# Patient Record
Sex: Male | Born: 1995
Health system: Southern US, Community
[De-identification: ages and names within clinical notes are randomized; demographics above are authoritative.]

## PROBLEM LIST (undated history)

## (undated) DIAGNOSIS — L700 Acne vulgaris: Secondary | ICD-10-CM

## (undated) DIAGNOSIS — L03213 Periorbital cellulitis: Secondary | ICD-10-CM

## (undated) HISTORY — DX: Periorbital cellulitis: L03.213

## (undated) HISTORY — DX: Acne vulgaris: L70.0

---

## 2016-10-18 DIAGNOSIS — L03213 Periorbital cellulitis: Secondary | ICD-10-CM

## 2016-10-18 HISTORY — DX: Periorbital cellulitis: L03.213

## 2016-11-03 DIAGNOSIS — J019 Acute sinusitis, unspecified: Secondary | ICD-10-CM | POA: Diagnosis not present

## 2016-11-03 DIAGNOSIS — R51 Headache: Secondary | ICD-10-CM | POA: Diagnosis not present

## 2016-11-04 ENCOUNTER — Emergency Department (HOSPITAL_BASED_OUTPATIENT_CLINIC_OR_DEPARTMENT_OTHER): Payer: BLUE CROSS/BLUE SHIELD

## 2016-11-04 ENCOUNTER — Encounter (HOSPITAL_BASED_OUTPATIENT_CLINIC_OR_DEPARTMENT_OTHER): Payer: Self-pay | Admitting: *Deleted

## 2016-11-04 ENCOUNTER — Emergency Department (HOSPITAL_BASED_OUTPATIENT_CLINIC_OR_DEPARTMENT_OTHER)
Admission: EM | Admit: 2016-11-04 | Discharge: 2016-11-04 | Disposition: A | Discharge status: Home / Self Care | Payer: BLUE CROSS/BLUE SHIELD | Attending: Emergency Medicine | Admitting: Emergency Medicine

## 2016-11-04 DIAGNOSIS — L03213 Periorbital cellulitis: Secondary | ICD-10-CM | POA: Insufficient documentation

## 2016-11-04 DIAGNOSIS — R110 Nausea: Secondary | ICD-10-CM | POA: Diagnosis not present

## 2016-11-04 DIAGNOSIS — R220 Localized swelling, mass and lump, head: Secondary | ICD-10-CM | POA: Diagnosis not present

## 2016-11-04 DIAGNOSIS — R51 Headache: Secondary | ICD-10-CM | POA: Diagnosis present

## 2016-11-04 DIAGNOSIS — J0100 Acute maxillary sinusitis, unspecified: Secondary | ICD-10-CM | POA: Diagnosis not present

## 2016-11-04 MED ORDER — PROCHLORPERAZINE EDISYLATE 5 MG/ML IJ SOLN
10.0000 mg | Freq: Once | INTRAMUSCULAR | Status: AC
  Administered 2016-11-04: 10 mg via INTRAMUSCULAR
  Filled 2016-11-04: qty 2

## 2016-11-04 MED ORDER — CLINDAMYCIN HCL 150 MG PO CAPS
450.0000 mg | ORAL_CAPSULE | Freq: Once | ORAL | Status: AC
Start: 2016-11-04 — End: 2016-11-04
  Administered 2016-11-04: 450 mg via ORAL
  Filled 2016-11-04: qty 3

## 2016-11-04 MED ORDER — DIPHENHYDRAMINE HCL 50 MG/ML IJ SOLN
25.0000 mg | Freq: Once | INTRAMUSCULAR | Status: AC
  Administered 2016-11-04: 25 mg via INTRAMUSCULAR
  Filled 2016-11-04: qty 1

## 2016-11-04 MED ORDER — AMOXICILLIN-POT CLAVULANATE 875-125 MG PO TABS: 1.0000 | ORAL_TABLET | Freq: Two times a day (BID) | ORAL | 0 refills | Status: DC

## 2016-11-04 MED ORDER — SODIUM CHLORIDE 0.9 % IV BOLUS (SEPSIS)
1000.0000 mL | Freq: Once | INTRAVENOUS | Status: AC
  Administered 2016-11-04: 1000 mL via INTRAVENOUS

## 2016-11-04 MED ORDER — IOPAMIDOL (ISOVUE-300) INJECTION 61%
100.0000 mL | Freq: Once | INTRAVENOUS | Status: AC | PRN
  Administered 2016-11-04: 100 mL via INTRAVENOUS

## 2016-11-04 MED ORDER — MAGNESIUM SULFATE 2 GM/50ML IV SOLN
2.0000 g | Freq: Once | INTRAVENOUS | Status: AC
  Administered 2016-11-04: 2 g via INTRAVENOUS
  Filled 2016-11-04: qty 50

## 2016-11-04 MED ORDER — KETOROLAC TROMETHAMINE 30 MG/ML IJ SOLN
30.0000 mg | Freq: Once | INTRAMUSCULAR | Status: AC
  Administered 2016-11-04: 30 mg via INTRAVENOUS
  Filled 2016-11-04: qty 1

## 2016-11-04 NOTE — Discharge Instructions (Signed)
Take tylenol 2 pills 4 times a day and motrin 4 pills 3 times a day.  Drink plenty of fluids.  Return for worsening shortness of breath, headache, confusion. Follow up with your family doctor.   

## 2016-11-04 NOTE — ED Provider Notes (Signed)
[facility] Provider Note   CSN: [id number] Arrival date & time: 11/04/16  1545  By signing my name below, I, Vista Mink, attest that this documentation has been prepared under the direction and in the presence of No att. providers found. Electronically signed, Vista Mink, ED Scribe. 11/04/16. 4:34 PM.  History   Chief Complaint Chief Complaint  Patient presents with  . Headache    HPI HPI Comments: Max Simon is a 20 y.o. male, with no pertinent PMHx, who presents to the Emergency Department complaining of gradually worsening, constant headache that started two days ago. Pt reports associated symptoms including photophobia and nausea but has not vomited yet. He also reports a pain described as "around the sides of my eyes". Pt has exacerbating pain with movement of the eyes. He went to urgent care yesterday and was dx with a sinus infection. He was given  a prescription for Fioricet and has taken it as directed. No acute numbness, tingling or unilateral weakness. He has not had any difficulty ambulating. No fever, neck pain or congestion.  The history is provided by the patient and a parent. No language interpreter was used.    No past medical history on file.  There are no active problems to display for this patient.   History reviewed. No pertinent surgical history.   Home Medications    Prior to Admission medications   Medication Sig Start Date End Date Taking? Authorizing Provider  amoxicillin-clavulanate (AUGMENTIN) 875-125 MG tablet Take 1 tablet by mouth every 12 (twelve) hours. 11/04/16   Melene Plan, DO    Family History No family history on file.  Social History Social History  Substance Use Topics  . Smoking status: Never Smoker  . Smokeless tobacco: Never Used  . Alcohol use Not on file    Allergies   Patient has no known allergies.   Review of Systems Review of Systems  Constitutional: Negative for chills and fever.  HENT: Negative  for congestion and facial swelling.   Eyes: Positive for photophobia and pain. Negative for discharge and visual disturbance.  Respiratory: Negative for shortness of breath.   Cardiovascular: Negative for chest pain and palpitations.  Gastrointestinal: Positive for nausea. Negative for abdominal pain, diarrhea and vomiting.  Musculoskeletal: Negative for arthralgias and myalgias.  Skin: Negative for color change and rash.  Neurological: Positive for headaches. Negative for tremors and syncope.  Psychiatric/Behavioral: Negative for confusion and dysphoric mood.  All other systems reviewed and are negative.   Physical Exam Updated Vital Signs BP 112/65 (BP Location: Right Arm)   Pulse 100   Temp 97.8 F (36.6 C) (Oral)   Resp 18   Ht 5\' 11"  (1.803 m)   Wt 165 lb (74.8 kg)   SpO2 100%   BMI 23.01 kg/m   Physical Exam  Constitutional: He is oriented to person, place, and time. He appears well-developed and well-nourished.  HENT:  Head: Normocephalic and atraumatic.  Eyes: EOM are normal. Pupils are equal, round, and reactive to light.  Erythema around the right eye with mild swelling noted. EOM's intact and did not seem painful. Swollen turbunance. Posterior nasal drip.  Neck: Normal range of motion. Neck supple. No JVD present.  Cardiovascular: Normal rate and regular rhythm.  Exam reveals no gallop and no friction rub.   No murmur heard. Pulmonary/Chest: No respiratory distress. He has no wheezes.  Abdominal: He exhibits no distension. There is no rebound and no guarding.  Musculoskeletal: Normal range of motion.  Neurological: He is alert and oriented to person, place, and time. He has normal strength. No cranial nerve deficit or sensory deficit. He displays a negative Romberg sign. Coordination and gait normal. GCS eye subscore is 4. GCS verbal subscore is 5. GCS motor subscore is 6.  Reflex Scores:      Tricep reflexes are 2+ on the right side and 2+ on the left side.       Bicep reflexes are 2+ on the right side and 2+ on the left side.      Brachioradialis reflexes are 2+ on the right side and 2+ on the left side.      Patellar reflexes are 2+ on the right side and 2+ on the left side.      Achilles reflexes are 2+ on the right side and 2+ on the left side. Normal neuro exam  Skin: No rash noted. No pallor.  Psychiatric: He has a normal mood and affect. His behavior is normal.  Nursing note and vitals reviewed.    ED Treatments / Results  DIAGNOSTIC STUDIES: Oxygen Saturation is 100% on RA, normal by my interpretation.  COORDINATION OF CARE: 4:33 PM-Discussed treatment plan with pt at bedside and pt agreed to plan.   Labs (all labs ordered are listed, but only abnormal results are displayed) Labs Reviewed - No data to display  EKG  EKG Interpretation None       Radiology Ct Maxillofacial W Contrast  Result Date: 11/04/2016 CLINICAL DATA:  Right eye swelling for 2 days. Question postseptal cellulitis. EXAM: CT MAXILLOFACIAL WITH CONTRAST TECHNIQUE: Multidetector CT imaging of the maxillofacial structures was performed. Multiplanar CT image reconstructions were also generated. A small metallic BB was placed on the right temple in order to reliably differentiate right from left. COMPARISON:  None. FINDINGS: Osseous: No fracture or mandibular dislocation. No destructive process. Orbits: Mild appearing soft tissue swelling is seen about the right eye. There is no postseptal swelling. No fluid collection is identified. Orbital fat is clear. The globes are intact and lenses located. Optic nerve and extraocular muscles appear normal. Sinuses: The left sphenoid and right frontal sinuses are nearly completely opacified. There is mild mucosal thickening in the right sphenoid sinus. Imaged paranasal sinuses are otherwise clear. Soft tissues: Negative. Limited intracranial: Negative. IMPRESSION: Mild appearing preseptal soft tissue swelling about the right eye.  Negative for abscess or postseptal swelling. Near complete opacification of the right frontal and left sphenoid sinuses. Mild mucosal thickening in the right sphenoid sinus is also noted. Electronically Signed   By: Drusilla Kannerhomas  Dalessio M.D.   On: 11/04/2016 18:28    Procedures Procedures (including critical care time)  Medications Ordered in ED Medications  prochlorperazine (COMPAZINE) injection 10 mg (10 mg Intramuscular Given 11/04/16 1643)  diphenhydrAMINE (BENADRYL) injection 25 mg (25 mg Intramuscular Given 11/04/16 1644)  clindamycin (CLEOCIN) capsule 450 mg (450 mg Oral Given 11/04/16 1643)  sodium chloride 0.9 % bolus 1,000 mL (0 mLs Intravenous Stopped 11/04/16 1851)  ketorolac (TORADOL) 30 MG/ML injection 30 mg (30 mg Intravenous Given 11/04/16 1737)  magnesium sulfate IVPB 2 g 50 mL (0 g Intravenous Stopped 11/04/16 1851)  iopamidol (ISOVUE-300) 61 % injection 100 mL (100 mLs Intravenous Contrast Given 11/04/16 1752)     Initial Impression / Assessment and Plan / ED Course  I have reviewed the triage vital signs and the nursing notes.  Pertinent labs & imaging results that were available during my care of the patient were reviewed by me and  considered in my medical decision making (see chart for details).  Clinical Course     20 yo M With a chief complaint of a right-sided headache. Going on for the past week or so. Worse with bright lights and loud noises. Saw a urgent care physician and was diagnosed with sinusitis. He had a couple of IM injections and was sent home. Woke up this morning with worsening pain. Denies fevers having some pain worse around his right eye. Some pain with right eye movement. Patient was not a significant cellulitis around his eye initially doubted the possibility of post-septal cellulitis. He is given a headache cocktail with no improvement. Since he had no improvement and continued symptoms was given magnesium and Toradol. CT of the face was negative for  post septal cellulitis. He also was noted to have right frontal and right maxillary sinusitis. Start on Augmentin. Discharge home.  11:30 PM:  I have discussed the diagnosis/risks/treatment options with the patient and family and believe the pt to be eligible for discharge home to follow-up with PCP. We also discussed returning to the ED immediately if new or worsening sx occur. We discussed the sx which are most concerning (e.g., sudden worsening pain, fever, inability to tolerate by mouth) that necessitate immediate return. Medications administered to the patient during their visit and any new prescriptions provided to the patient are listed below.  Medications given during this visit Medications  prochlorperazine (COMPAZINE) injection 10 mg (10 mg Intramuscular Given 11/04/16 1643)  diphenhydrAMINE (BENADRYL) injection 25 mg (25 mg Intramuscular Given 11/04/16 1644)  clindamycin (CLEOCIN) capsule 450 mg (450 mg Oral Given 11/04/16 1643)  sodium chloride 0.9 % bolus 1,000 mL (0 mLs Intravenous Stopped 11/04/16 1851)  ketorolac (TORADOL) 30 MG/ML injection 30 mg (30 mg Intravenous Given 11/04/16 1737)  magnesium sulfate IVPB 2 g 50 mL (0 g Intravenous Stopped 11/04/16 1851)  iopamidol (ISOVUE-300) 61 % injection 100 mL (100 mLs Intravenous Contrast Given 11/04/16 1752)     The patient appears reasonably screen and/or stabilized for discharge and I doubt any other medical condition or other [facility] requiring further screening, evaluation, or treatment in the ED at this time prior to discharge.    Final Clinical Impressions(s) / ED Diagnoses   Final diagnoses:  Preseptal cellulitis of right eye  Acute maxillary sinusitis, recurrence not specified    New Prescriptions Discharge Medication List as of 11/04/2016  6:42 PM    START taking these medications   Details  amoxicillin-clavulanate (AUGMENTIN) 875-125 MG tablet Take 1 tablet by mouth every 12 (twelve) hours., Starting Mon 11/04/2016,  Print      I personally performed the services described in this documentation, which was scribed in my presence. The recorded information has been reviewed and is accurate.     Melene Planan Truda Staub, DO 11/04/16 2330

## 2016-11-04 NOTE — ED Triage Notes (Signed)
C/o headache over right eye. Onset Saturday. Nausea. Was seen at urgent care yesterday and today and they sent him here. Swelling noted to around right eye.

## 2016-11-22 DIAGNOSIS — J029 Acute pharyngitis, unspecified: Secondary | ICD-10-CM | POA: Diagnosis not present

## 2016-11-22 DIAGNOSIS — J0111 Acute recurrent frontal sinusitis: Secondary | ICD-10-CM | POA: Diagnosis not present

## 2017-12-23 IMAGING — CT CT MAXILLOFACIAL W/ CM
3 of 4 series · 15 of 47 positions shown, 18 images · IV contrast (agent unspecified)
Comparison: None.

CLINICAL DATA: Right eye swelling for 2 days. Question postseptal
cellulitis.

EXAM:
CT MAXILLOFACIAL WITH CONTRAST
TECHNIQUE: Multidetector CT imaging of the maxillofacial structures was
performed. Multiplanar CT image reconstructions were also generated.
A small metallic BB was placed on the right temple in order to
reliably differentiate right from left.

[Series 2: max soft · axial · 0.40mm/px · z∈[-231,-75]mm · 9 of 92 slices shown, 12 images]
[im 7/92  brain]
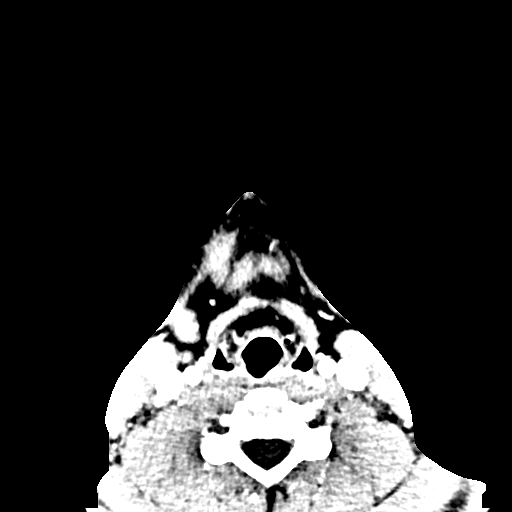
[im 7/92  bone]
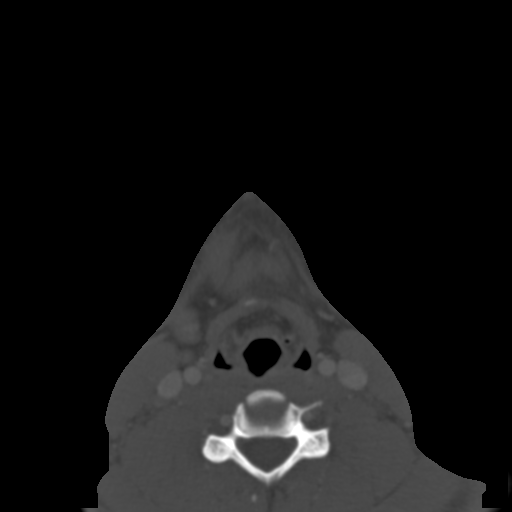
[im 16/92  bone]
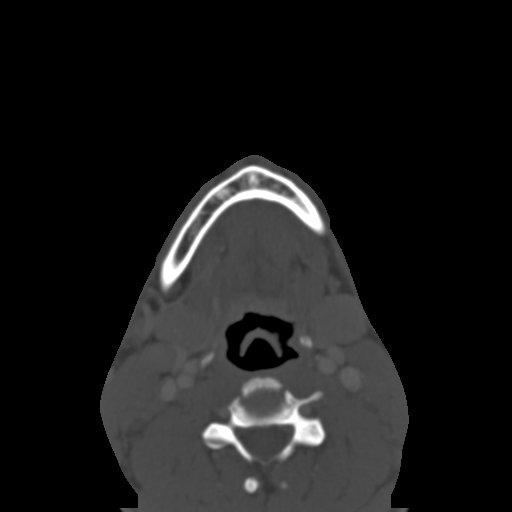
[im 26/92  bone]
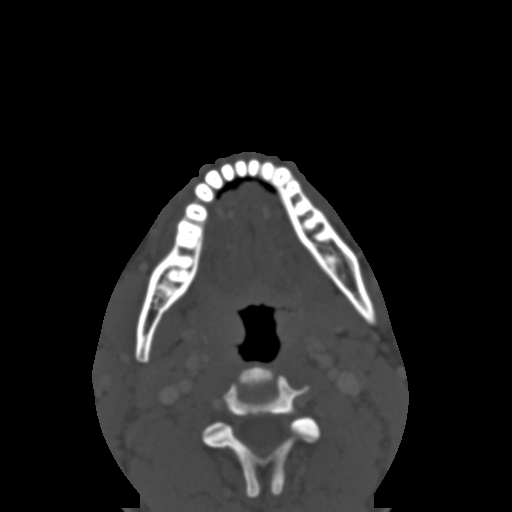
[im 35/92  bone]
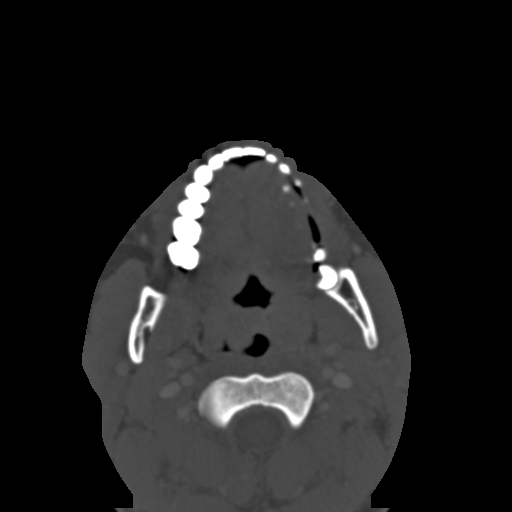
[im 48/92  brain]
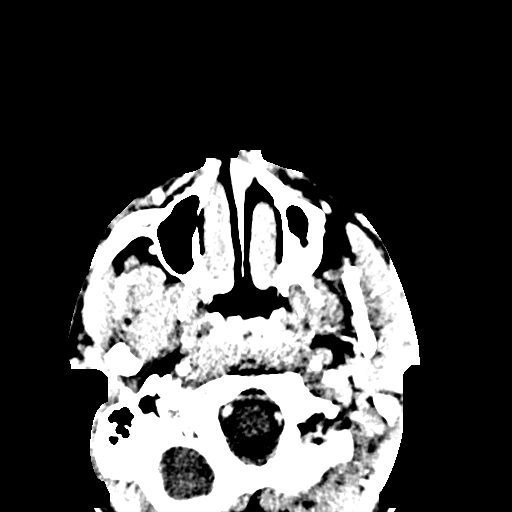
[im 48/92  bone]
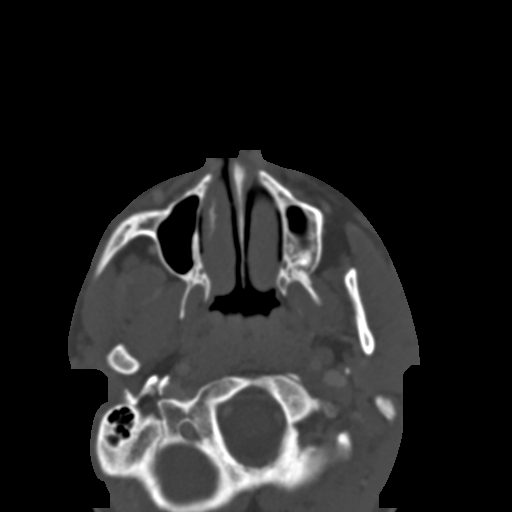
[im 57/92  bone]
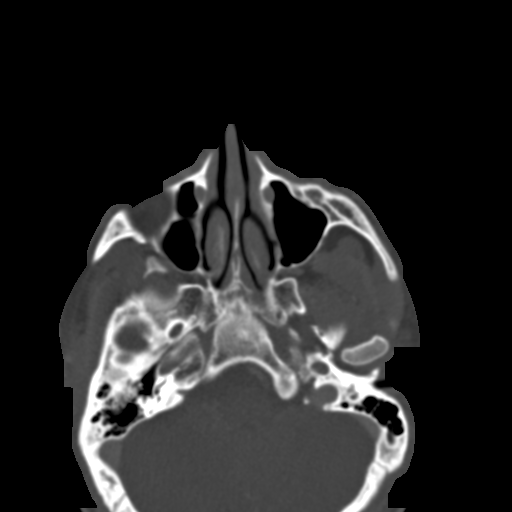
[im 66/92  bone]
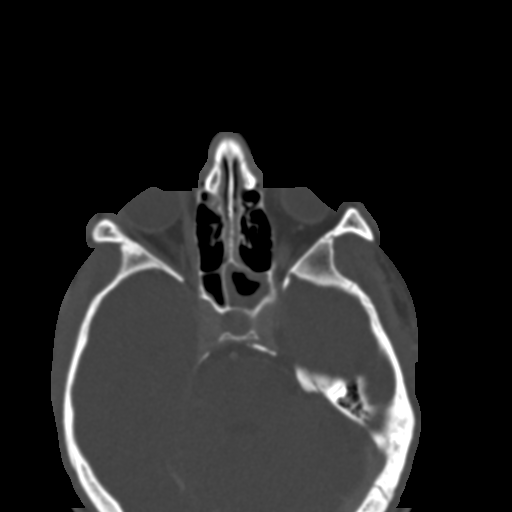
[im 76/92  bone]
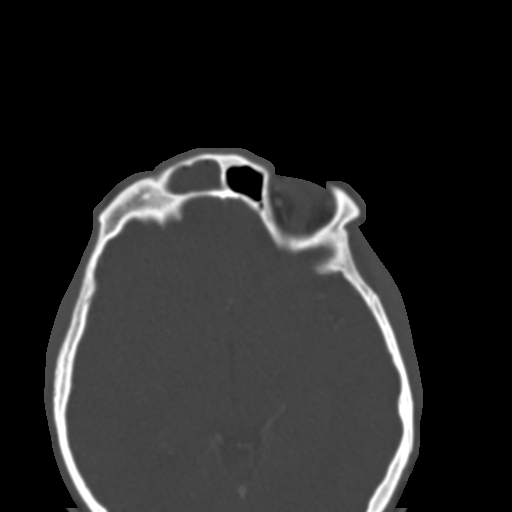
[im 85/92  brain]
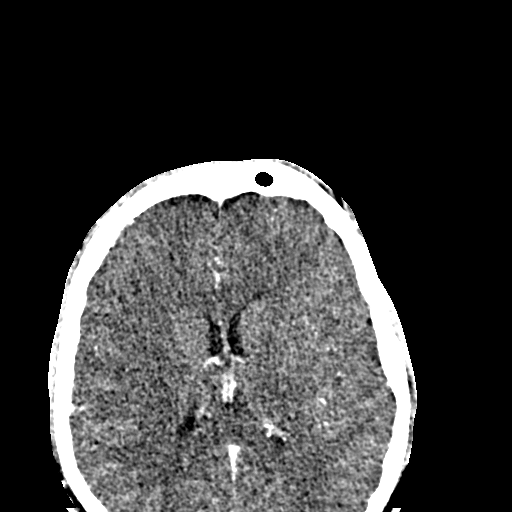
[im 85/92  bone]
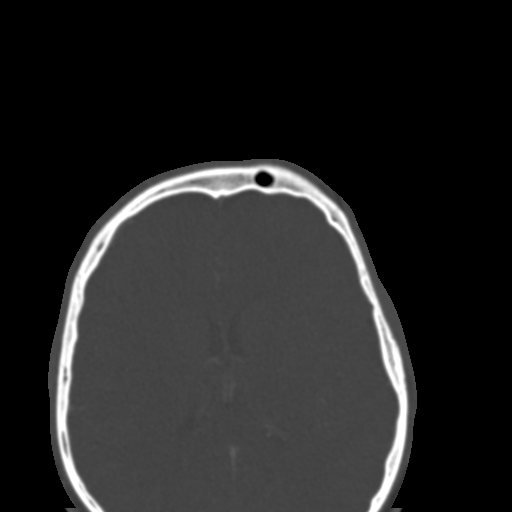

[Series 7: sagittal soft · sagittal · 0.34mm/px · 3 of 92 slices shown]
[im 31/92  bone]
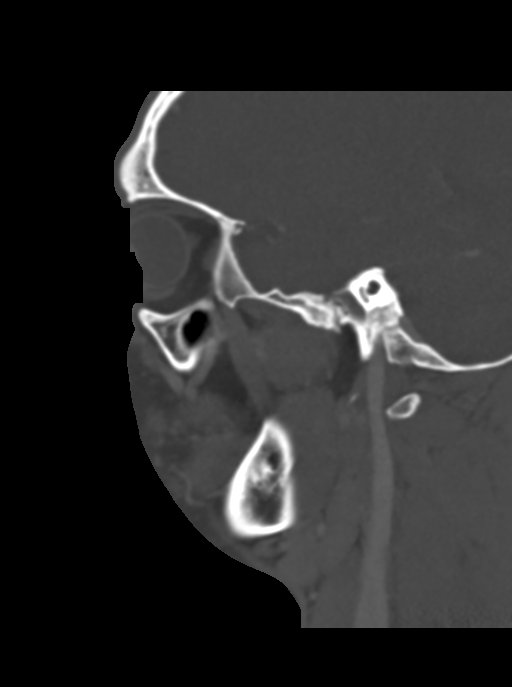
[im 46/92  bone]
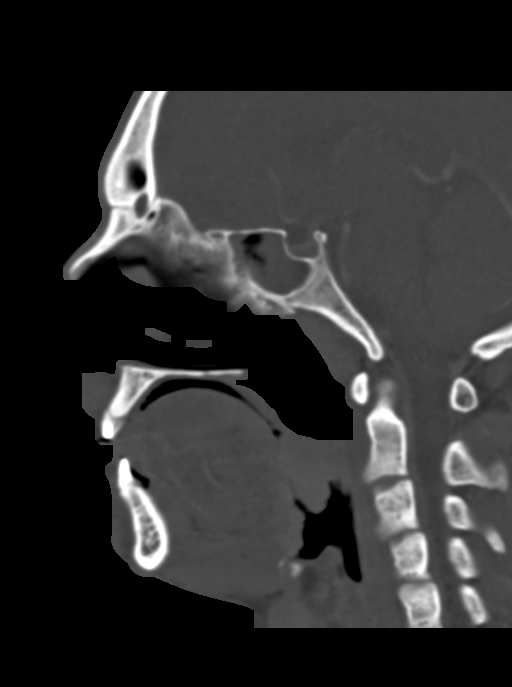
[im 61/92  bone]
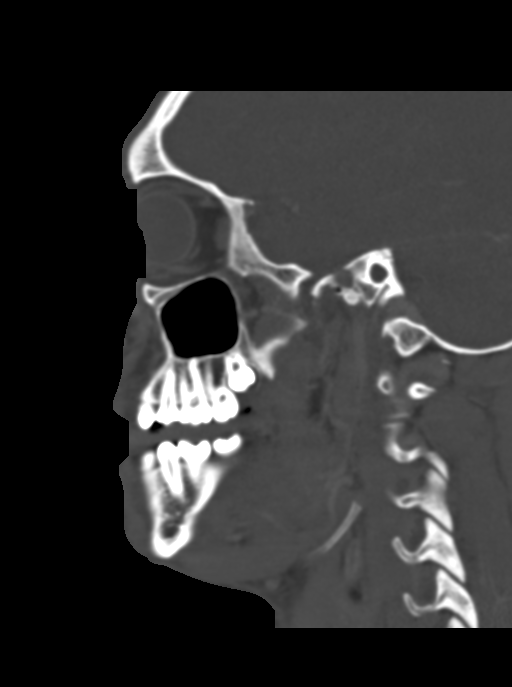

[Series 8: coronal bone · coronal · 0.40mm/px · 3 of 92 slices shown]
[im 23/92  bone]
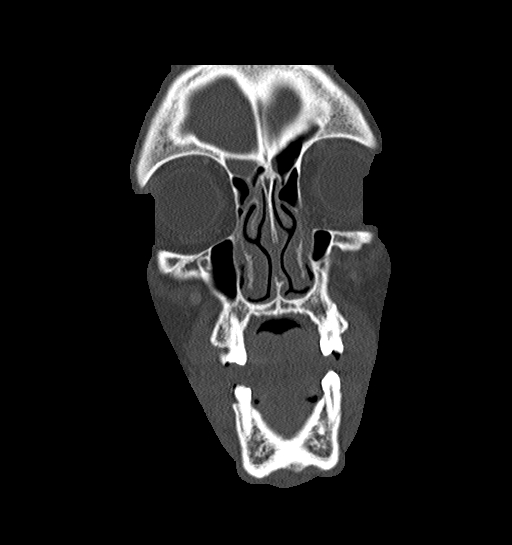
[im 46/92  bone]
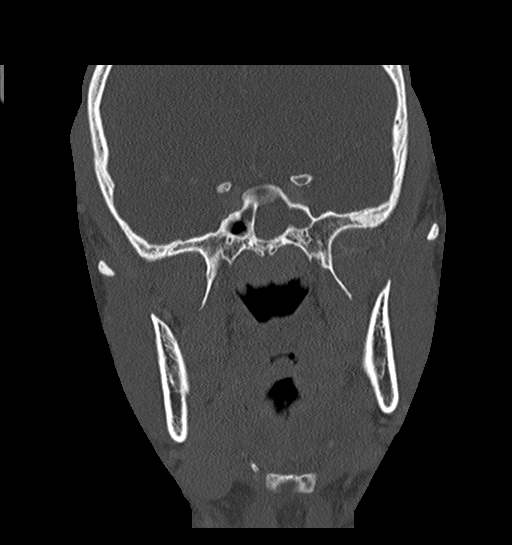
[im 69/92  bone]
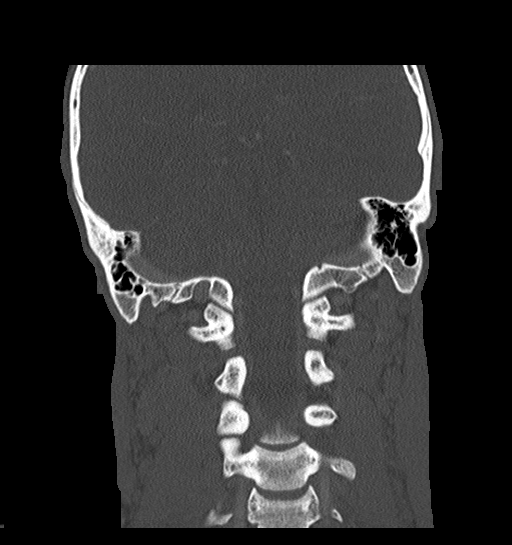

[15 of 47 positions shown; findings below may reference images not displayed]

FINDINGS: Osseous: No fracture or mandibular dislocation. No destructive
process.

Orbits: Mild appearing soft tissue swelling is seen about the right
eye. There is no postseptal swelling. No fluid collection is
identified. Orbital fat is clear. The globes are intact and lenses
located. Optic nerve and extraocular muscles appear normal.

Sinuses: The left sphenoid and right frontal sinuses are nearly
completely opacified. There is mild mucosal thickening in the right
sphenoid sinus. Imaged paranasal sinuses are otherwise clear.

Soft tissues: Negative.

Limited intracranial: Negative.
IMPRESSION: Mild appearing preseptal soft tissue swelling about the right eye.
Negative for abscess or postseptal swelling.

Near complete opacification of the right frontal and left sphenoid
sinuses. Mild mucosal thickening in the right sphenoid sinus is also
noted.

## 2018-01-14 DIAGNOSIS — T2124XA Burn of second degree of lower back, initial encounter: Secondary | ICD-10-CM | POA: Diagnosis not present

## 2018-03-05 ENCOUNTER — Ambulatory Visit: Payer: BLUE CROSS/BLUE SHIELD | Admitting: Family Medicine

## 2018-03-05 ENCOUNTER — Encounter: Payer: Self-pay | Admitting: Family Medicine

## 2018-03-05 VITALS — BP 126/81 | HR 91 | Temp 97.8°F | Ht 69.0 in | Wt 183.0 lb

## 2018-03-05 DIAGNOSIS — L700 Acne vulgaris: Secondary | ICD-10-CM | POA: Diagnosis not present

## 2018-03-05 MED ORDER — CLINDAMYCIN PHOS-BENZOYL PEROX 1-5 % EX GEL: Freq: Two times a day (BID) | CUTANEOUS | 1 refills | Status: DC

## 2018-03-05 NOTE — Progress Notes (Signed)
Office Note 03/05/2018  CC:  Chief Complaint  Patient presents with  . Establish Care    Bad Acne   HPI:  Max Simon is a 22 y.o. male who is here to establish care and discuss acne. Patient's most recent primary MD: [place] in [place]. Old records were not reviewed prior to or during today's visit.  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  Has increased in severity the last 1 yr.  Tried otc products for acne and nothing helping. No shoulders, back, neck involvement. No rx med for acne in the past.  Past Medical History:  Diagnosis Date  . Preseptal cellulitis 10/2016   CT in ED ruled out postseptal infection.  +Frontal and R max sinusitis on CT.    History reviewed. No pertinent surgical history.  Family History  Problem Relation Age of Onset  . Learning disabilities Father     Social History   Socioeconomic History  . Marital status: Single    Spouse name: Not on file  . Number of children: Not on file  . Years of education: Not on file  . Highest education level: Not on file  Occupational History  . Not on file  Social Needs  . Financial resource strain: Not on file  . Food insecurity:    Worry: Not on file    Inability: Not on file  . Transportation needs:    Medical: Not on file    Non-medical: Not on file  Tobacco Use  . Smoking status: Never Smoker  . Smokeless tobacco: Never Used  Substance and Sexual Activity  . Alcohol use: Yes    Alcohol/week: 1.8 oz    Types: 3 Cans of beer per week    Comment: 3 cans a week  . Drug use: Never  . Sexual activity: Yes    Birth control/protection: Condom  Lifestyle  . Physical activity:    Days per week: Not on file    Minutes per session: Not on file  . Stress: Not on file  Relationships  . Social connections:    Talks on phone: Not on file    Gets together: Not on file    Attends religious service: Not on file    Active member of club or organization: Not on file    Attends meetings of clubs or  organizations: Not on file    Relationship status: Not on file  . Intimate partner violence:    Fear of current or ex partner: Not on file    Emotionally abused: Not on file    Physically abused: Not on file    Forced sexual activity: Not on file  Other Topics Concern  . Not on file  Social History Narrative   [place]   Graduating from [employer] 2019.   Accounting major.   No signif alc.  No tob or drugs.    MEDS: none  No Known Allergies  ROS Review of Systems  Constitutional: Negative for fatigue and fever.  HENT: Negative for congestion and sore throat.   Eyes: Negative for visual disturbance.  Respiratory: Negative for cough.   Cardiovascular: Negative for chest pain.  Gastrointestinal: Negative for abdominal pain and nausea.  Genitourinary: Negative for dysuria.  Musculoskeletal: Negative for back pain and joint swelling.  Skin: Negative for rash.  Neurological: Negative for weakness and headaches.  Hematological: Negative for adenopathy.    PE; Blood pressure 126/81, pulse 91, temperature 97.8 F (36.6 C), temperature source Oral, height  5\' 9"  (1.753 m), weight 183 lb (83 kg). Gen: Alert, well appearing.  Patient is oriented to person, place, time, and situation. AFFECT: pleasant, lucid thought and speech. CV: RRR, no m/r/g.   LUNGS: CTA bilat, nonlabored resps, good aeration in all lung fields. EXT: no clubbing, cyanosis, or edema.  SKIN: back and neck w/out any lesions or rashes. Face with fairly numerous scattered comedonal lesions, mildly inflamed but no nodules.  Pertinent labs:  No results found for: TSH No results found for: WBC, HGB, HCT, MCV, PLT No results found for: CREATININE, BUN, NA, K, CL, CO2 No results found for: ALT, AST, GGT, ALKPHOS, BILITOT No results found for: CHOL No results found for: HDL No results found for: LDLCALC No results found for: TRIG No results found for: CHOLHDL No results found for: PSA   ASSESSMENT  AND PLAN:   Acne vulgaris involving face only: benzaclin gel bid. Discussed correct way to apply this. Therapeutic expectations and side effect profile of medication discussed today.  Patient's questions answered.  An After Visit Summary was printed and given to the patient.  Return in about 1 month (around 04/02/2018) for f/u acne.  Signed:  Santiago Bumpers, MD           03/05/2018

## 2018-03-12 ENCOUNTER — Ambulatory Visit: Payer: BLUE CROSS/BLUE SHIELD | Admitting: Family Medicine

## 2018-04-07 ENCOUNTER — Encounter: Payer: Self-pay | Admitting: Family Medicine

## 2018-04-07 ENCOUNTER — Ambulatory Visit: Payer: BLUE CROSS/BLUE SHIELD | Admitting: Family Medicine

## 2018-04-07 VITALS — BP 108/68 | HR 85 | Temp 98.4°F | Resp 16 | Ht 69.0 in | Wt 179.4 lb

## 2018-04-07 DIAGNOSIS — Z23 Encounter for immunization: Secondary | ICD-10-CM

## 2018-04-07 DIAGNOSIS — L700 Acne vulgaris: Secondary | ICD-10-CM

## 2018-04-07 MED ORDER — CLINDAMYCIN PHOS-BENZOYL PEROX 1-5 % EX GEL: Freq: Two times a day (BID) | CUTANEOUS | 11 refills | Status: DC

## 2018-04-07 NOTE — Addendum Note (Signed)
Addended by: Smitty Knudsen on: 04/07/2018 10:05 AM   Modules accepted: Orders

## 2018-04-07 NOTE — Progress Notes (Signed)
OFFICE VISIT  04/07/2018   CC:  Chief Complaint  Patient presents with  . Follow-up    acne    HPI:    Patient is a 22 y.o. Caucasian male who presents for 1 mo f/u acne. Started benzaclin gel last visit. Working very well.  He is only using it qd regularly but sometimes bid. No side effects except mild drying of skin of face.  This is minimal, no problem.   Past Medical History:  Diagnosis Date  . Preseptal cellulitis 10/2016   CT in ED ruled out postseptal infection.  +Frontal and R max sinusitis on CT.    History reviewed. No pertinent surgical history.  Outpatient Medications Prior to Visit  Medication Sig Dispense Refill  . clindamycin-benzoyl peroxide (BENZACLIN) gel Apply topically 2 (two) times daily. 50 g 1   No facility-administered medications prior to visit.     No Known Allergies  ROS As per HPI  PE: Blood pressure 108/68, pulse 85, temperature 98.4 F (36.9 C), temperature source Oral, resp. rate 16, height  (1.753 m), weight 179 lb 6 oz (81.4 kg), SpO2 96 %. Gen: Alert, well appearing.  Patient is oriented to person, place, time, and situation. AFFECT: pleasant, lucid thought and speech. SKIN: face with mildly pinkish, dry macules/papules w/out blackhead or white head.  NO cystic lesions---areas involved are forehead, chin, both cheeks.  Nothing on neck or shoulders or back. A hint of a few small scars on forehead and cheeks.  LABS:  none  IMPRESSION AND PLAN:  Acne vulgaris: great response to benzaclin gel.  Pt very happy with results at this time. He'll continue to use this qd-bid. RF's eRx'd today.  An After Visit Summary was printed and given to the patient.  FOLLOW UP: Return in about 2 months (around 06/07/2018) for f/u acne.--before he goes back to [employer].  Signed:  Santiago Bumpers, MD           04/07/2018

## 2018-05-19 DIAGNOSIS — L255 Unspecified contact dermatitis due to plants, except food: Secondary | ICD-10-CM | POA: Diagnosis not present

## 2018-05-19 DIAGNOSIS — B359 Dermatophytosis, unspecified: Secondary | ICD-10-CM | POA: Diagnosis not present

## 2018-06-18 DIAGNOSIS — L255 Unspecified contact dermatitis due to plants, except food: Secondary | ICD-10-CM | POA: Diagnosis not present

## 2018-06-24 ENCOUNTER — Encounter: Payer: Self-pay | Admitting: Family Medicine

## 2018-06-24 ENCOUNTER — Ambulatory Visit: Payer: BLUE CROSS/BLUE SHIELD | Admitting: Family Medicine

## 2018-06-24 VITALS — BP 112/71 | HR 70 | Temp 98.2°F | Resp 16 | Ht 69.0 in | Wt 174.2 lb

## 2018-06-24 DIAGNOSIS — L700 Acne vulgaris: Secondary | ICD-10-CM | POA: Diagnosis not present

## 2018-06-24 DIAGNOSIS — L255 Unspecified contact dermatitis due to plants, except food: Secondary | ICD-10-CM

## 2018-06-24 MED ORDER — FLUTICASONE PROPIONATE 0.05 % EX CREA: TOPICAL_CREAM | Freq: Two times a day (BID) | CUTANEOUS | 1 refills | Status: DC

## 2018-06-24 NOTE — Progress Notes (Signed)
OFFICE VISIT  06/24/2018   CC:  Chief Complaint  Patient presents with  . Follow-up    Acne  . Rash    LE, Poison Ivy?     HPI:    Patient is a 22 y.o. Caucasian male who presents for 3 mo f/u acne. Last f/u visit he was responding well to benzaclin gel. He is heading back to [employer] for fall semester in 4d so we wanted to get a f/u in before he went.  Doing very well, using benzaclin prn only at this time. No side effects at all from this med.  Has had contact with poison ivy in the last 7-10 and has had bilat LL itchy rash. Applying otc hydrocortisone and this is helping some.  Itching pretty minimal at this time but rash still pretty significant.  Past Medical History:  Diagnosis Date  . Preseptal cellulitis 10/2016   CT in ED ruled out postseptal infection.  +Frontal and R max sinusitis on CT.    No past surgical history on file.  Outpatient Medications Prior to Visit  Medication Sig Dispense Refill  . clindamycin-benzoyl peroxide (BENZACLIN) gel Apply topically 2 (two) times daily. 50 g 11   No facility-administered medications prior to visit.     No Known Allergies  ROS As per HPI  PE: Blood pressure 112/71, pulse 70, temperature 98.2 F (36.8 C), temperature source Oral, resp. rate 16, height 5\' 9"  (1.753 m), weight 174 lb 4 oz (79 kg), SpO2 95 %. Gen: Alert, well appearing.  Patient is oriented to person, place, time, and situation. AFFECT: pleasant, lucid thought and speech. Face: a couple of pink comedones on chin, otherwise NO active acne.  He has some scattered 1-2 mm hyperpigmented scars, mainly on forehead.  Shoulders and back are completely clear. Both LL's with diffuse/confluent pinkish maculopapular rash.  Dry, a bit of superficial peeling.  No erythema or tenderness.  LABS:  none  IMPRESSION AND PLAN:  1) Acne vulgaris: responding VERY good to benzaclin gel, which he is only using prn at this time.  2) Contact dermatitis --poison ivy/oak.    Mild improvement with otc hydrocortisone. I sent in rx for cutivate 0.05% cream to use bid to help this clear up a bit quicker.  An After Visit Summary was printed and given to the patient.  FOLLOW UP: Return in about 1 year (around 06/25/2019) for annual CPE (fasting).  Signed:  Santiago BumpersPhil McGowen, MD           06/24/2018

## 2019-06-28 ENCOUNTER — Encounter: Payer: BLUE CROSS/BLUE SHIELD | Admitting: Family Medicine

## 2019-06-28 DIAGNOSIS — Z0289 Encounter for other administrative examinations: Secondary | ICD-10-CM

## 2019-06-28 NOTE — Progress Notes (Deleted)
Office Note 06/28/2019  CC: No chief complaint on file.   HPI:  Max Simon is a 23 y.o. White male who is here for annual health maintenance exam.   Past Medical History:  Diagnosis Date  . Acne vulgaris    great response to benzaclin gel  . Preseptal cellulitis 10/2016   CT in ED ruled out postseptal infection.  +Frontal and R max sinusitis on CT.    No past surgical history on file.  Family History  Problem Relation Age of Onset  . Learning disabilities Father     Social History   Socioeconomic History  . Marital status: Single    Spouse name: Not on file  . Number of children: Not on file  . Years of education: Not on file  . Highest education level: Not on file  Occupational History  . Not on file  Social Needs  . Financial resource strain: Not on file  . Food insecurity    Worry: Not on file    Inability: Not on file  . Transportation needs    Medical: Not on file    Non-medical: Not on file  Tobacco Use  . Smoking status: Never Smoker  . Smokeless tobacco: Never Used  Substance and Sexual Activity  . Alcohol use: Yes    Alcohol/week: 3.0 standard drinks    Types: 3 Cans of beer per week    Comment: 3 cans a week  . Drug use: Never  . Sexual activity: Yes    Birth control/protection: Condom  Lifestyle  . Physical activity    Days per week: Not on file    Minutes per session: Not on file  . Stress: Not on file  Relationships  . Social Herbalist on phone: Not on file    Gets together: Not on file    Attends religious service: Not on file    Active member of club or organization: Not on file    Attends meetings of clubs or organizations: Not on file    Relationship status: Not on file  . Intimate partner violence    Fear of current or ex partner: Not on file    Emotionally abused: Not on file    Physically abused: Not on file    Forced sexual activity: Not on file  Other Topics Concern  . Not on file  Social History  Narrative   Relocated from [facility] 2017.   Graduating from [employer] 2019.   Accounting major.   No signif alc.  No tob or drugs.    Outpatient Medications Prior to Visit  Medication Sig Dispense Refill  . clindamycin-benzoyl peroxide (BENZACLIN) gel Apply topically 2 (two) times daily. 50 g 11  . fluticasone (CUTIVATE) 0.05 % cream Apply topically 2 (two) times daily. 60 g 1   No facility-administered medications prior to visit.     No Known Allergies  ROS *** PE; There were no vitals taken for this visit. *** Pertinent labs:  none    ASSESSMENT AND PLAN:   Health maintenance exam: Reviewed age and gender appropriate health maintenance issues (prudent diet, regular exercise, health risks of tobacco and excessive alcohol, use of seatbelts, fire alarms in home, use of sunscreen).  Also reviewed age and gender appropriate health screening as well as vaccine recommendations. Vaccines: All UTD.  *** ? Mening #2?  Hep A #2? *** Labs: ***  An After Visit Summary was printed and given to the patient.  FOLLOW UP:  No follow-ups on file.  Signed:  Santiago BumpersPhil , MD           06/28/2019

## 2019-07-22 ENCOUNTER — Ambulatory Visit (INDEPENDENT_AMBULATORY_CARE_PROVIDER_SITE_OTHER): Payer: BC Managed Care – PPO | Admitting: Family Medicine

## 2019-07-22 ENCOUNTER — Encounter: Payer: Self-pay | Admitting: Family Medicine

## 2019-07-22 ENCOUNTER — Other Ambulatory Visit: Payer: Self-pay

## 2019-07-22 DIAGNOSIS — L700 Acne vulgaris: Secondary | ICD-10-CM | POA: Diagnosis not present

## 2019-07-22 MED ORDER — CLINDAMYCIN PHOS-BENZOYL PEROX 1-5 % EX GEL: Freq: Two times a day (BID) | CUTANEOUS | 11 refills | Status: AC

## 2019-07-22 NOTE — Progress Notes (Signed)
Virtual Visit via Video Note  I connected with pt on 07/22/19 at 11:00 AM EDT by a video enabled telemedicine application and verified that I am speaking with the correct person using two identifiers.  Location patient: home Location provider:work or home office Persons participating in the virtual visit: patient, provider  I discussed the limitations of evaluation and management by telemedicine and the availability of in person appointments. The patient expressed understanding and agreed to proceed.  elemedicine visit is a necessity given the COVID-19 restrictions in place at the current time.  HPI: 23 y/o WM being seen for f/u acne. I started him on benzaclin gel in the spring of 2019 and he responded very well.  Interim hx: Doing very well. Skin looks great. Uses benzaclin gel on and off--regularly but not every day. No complaints.  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  He is excited.   ROS: See pertinent positives and negatives per HPI.  Past Medical History:  Diagnosis Date  . Acne vulgaris    great response to benzaclin gel  . Preseptal cellulitis 10/2016   CT in ED ruled out postseptal infection.  +Frontal and R max sinusitis on CT.    No past surgical history on file.  Family History  Problem Relation Age of Onset  . Learning disabilities Father     SOCIAL HX:  Social History   Socioeconomic History  . Marital status: Single    Spouse name: Not on file  . Number of children: Not on file  . Years of education: Not on file  . Highest education level: Not on file  Occupational History  . Not on file  Social Needs  . Financial resource strain: Not on file  . Food insecurity    Worry: Not on file    Inability: Not on file  . Transportation needs    Medical: Not on file    Non-medical: Not on file  Tobacco Use  . Smoking status: Never Smoker  . Smokeless tobacco: Never  Used  Substance and Sexual Activity  . Alcohol use: Yes    Alcohol/week: 3.0 standard drinks    Types: 3 Cans of beer per week    Comment: 3 cans a week  . Drug use: Never  . Sexual activity: Yes    Birth control/protection: Condom  Lifestyle  . Physical activity    Days per week: Not on file    Minutes per session: Not on file  . Stress: Not on file  Relationships  . Social Musicianconnections    Talks on phone: Not on file    Gets together: Not on file    Attends religious service: Not on file    Active member of club or organization: Not on file    Attends meetings of clubs or organizations: Not on file    Relationship status: Not on file  Other Topics Concern  . Not on file  Social History Narrative   [place]   Graduating from [employer] 2019.   Accounting major.   No signif alc.  No tob or drugs.      Current Outpatient Medications:  .  clindamycin-benzoyl peroxide (BENZACLIN) gel, Apply topically 2 (two) times daily., Disp: 50 g, Rfl: 11 .  fluticasone (CUTIVATE) 0.05 % cream, Apply topically 2 (two) times daily., Disp: 60 g, Rfl: 1  EXAM:  VITALS per patient  if applicable: There were no vitals taken for this visit.   GENERAL: alert, oriented, appears well and in no acute distress  HEENT: atraumatic, conjunttiva clear, no obvious abnormalities on inspection of external nose and ears. Skin of face, ears, neck all w/out any lesions.  NECK: normal movements of the head and neck  LUNGS: on inspection no signs of respiratory distress, breathing rate appears normal, no obvious gross SOB, gasping or wheezing  CV: no obvious cyanosis  MS: moves all visible extremities without noticeable abnormality  PSYCH/NEURO: pleasant and cooperative, no obvious depression or anxiety, speech and thought processing grossly intact  LABS: none today  ASSESSMENT AND PLAN:  Discussed the following assessment and plan:  Acne: doing great on benzaclin gel on/off use. No  side effects. No new questions.   I discussed the assessment and treatment plan with the patient. The patient was provided an opportunity to ask questions and all were answered. The patient agreed with the plan and demonstrated an understanding of the instructions.   The patient was advised to call back or seek an in-person evaluation if the symptoms worsen or if the condition fails to improve as anticipated.  F/u: 1 yr  Signed:  Crissie Sickles, MD           07/22/2019

## 2019-12-08 DIAGNOSIS — Z20828 Contact with and (suspected) exposure to other viral communicable diseases: Secondary | ICD-10-CM | POA: Diagnosis not present

## 2020-01-08 DIAGNOSIS — Z20828 Contact with and (suspected) exposure to other viral communicable diseases: Secondary | ICD-10-CM | POA: Diagnosis not present
